# Patient Record
Sex: Male | Born: 1970 | Race: White | Hispanic: No | Marital: Married | Smoking: Never smoker
Health system: Southern US, Community
[De-identification: ages and names within clinical notes are randomized; demographics above are authoritative.]

---

## 2004-05-15 ENCOUNTER — Encounter: Admission: RE | Admit: 2004-05-15 | Discharge: 2004-05-15 | Payer: Self-pay | Admitting: *Deleted

## 2006-12-13 HISTORY — PX: OTHER SURGICAL HISTORY: SHX169

## 2008-03-05 ENCOUNTER — Ambulatory Visit (HOSPITAL_BASED_OUTPATIENT_CLINIC_OR_DEPARTMENT_OTHER): Admission: RE | Admit: 2008-03-05 | Discharge: 2008-03-05 | Payer: Self-pay | Admitting: Orthopedic Surgery

## 2009-02-15 ENCOUNTER — Emergency Department (HOSPITAL_BASED_OUTPATIENT_CLINIC_OR_DEPARTMENT_OTHER): Admission: EM | Admit: 2009-02-15 | Discharge: 2009-02-15 | Payer: Self-pay | Admitting: Emergency Medicine

## 2009-02-15 ENCOUNTER — Ambulatory Visit: Payer: Self-pay | Admitting: Interventional Radiology

## 2009-12-13 HISTORY — PX: RESECTION DISTAL CLAVICAL: SHX5053

## 2010-04-18 ENCOUNTER — Emergency Department (HOSPITAL_COMMUNITY): Admission: EM | Admit: 2010-04-18 | Discharge: 2010-04-18 | Payer: Self-pay | Admitting: Emergency Medicine

## 2010-04-18 ENCOUNTER — Emergency Department (HOSPITAL_COMMUNITY): Admission: EM | Admit: 2010-04-18 | Discharge: 2010-04-18 | Payer: Self-pay | Admitting: Family Medicine
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
## 2011-12-14 HISTORY — PX: ORIF TIBIA & FIBULA FRACTURES: SHX2131

## 2013-07-13 ENCOUNTER — Encounter: Payer: Self-pay | Admitting: Sports Medicine

## 2013-07-13 ENCOUNTER — Ambulatory Visit (INDEPENDENT_AMBULATORY_CARE_PROVIDER_SITE_OTHER): Payer: Self-pay | Admitting: Sports Medicine

## 2013-07-13 VITALS — BP 116/75 | HR 55 | Wt 174.0 lb

## 2013-07-13 DIAGNOSIS — Z23 Encounter for immunization: Secondary | ICD-10-CM

## 2013-07-13 DIAGNOSIS — Z7184 Encounter for health counseling related to travel: Secondary | ICD-10-CM | POA: Insufficient documentation

## 2013-07-13 DIAGNOSIS — Z299 Encounter for prophylactic measures, unspecified: Secondary | ICD-10-CM | POA: Insufficient documentation

## 2013-07-13 DIAGNOSIS — Z7189 Other specified counseling: Secondary | ICD-10-CM

## 2013-07-13 LAB — COMPREHENSIVE METABOLIC PANEL WITH GFR
ALT: 27 U/L (ref 0–53)
AST: 43 U/L — ABNORMAL HIGH (ref 0–37)
Albumin: 4.5 g/dL (ref 3.5–5.2)

## 2013-07-13 LAB — CBC
HCT: 44.3 % (ref 39.0–52.0)
Hemoglobin: 15.7 g/dL (ref 13.0–17.0)
MCH: 30.4 pg (ref 26.0–34.0)
MCHC: 35.4 g/dL (ref 30.0–36.0)
MCV: 85.7 fL (ref 78.0–100.0)
Platelets: 325 10*3/uL (ref 150–400)
RBC: 5.17 MIL/uL (ref 4.22–5.81)
RDW: 13.4 % (ref 11.5–15.5)
WBC: 4.9 K/uL (ref 4.0–10.5)

## 2013-07-13 LAB — LIPID PANEL
Cholesterol: 138 mg/dL (ref 0–200)
HDL: 41 mg/dL (ref >39)
LDL Cholesterol: 83 mg/dL (ref 0–99)
Total CHOL/HDL Ratio: 3.4 Ratio
Triglycerides: 69 mg/dL (ref <150)
VLDL: 14 mg/dL (ref 0–40)

## 2013-07-13 LAB — COMPREHENSIVE METABOLIC PANEL
Alkaline Phosphatase: 69 U/L (ref 39–117)
BUN: 15 mg/dL (ref 6–23)
CO2: 31 mEq/L (ref 19–32)
Calcium: 9.4 mg/dL (ref 8.4–10.5)
Chloride: 102 mEq/L (ref 96–112)
Creat: 0.93 mg/dL (ref 0.50–1.35)
Glucose, Bld: 93 mg/dL (ref 70–99)
Potassium: 4.6 mEq/L (ref 3.5–5.3)
Sodium: 137 mEq/L (ref 135–145)
Total Bilirubin: 0.9 mg/dL (ref 0.3–1.2)
Total Protein: 7.1 g/dL (ref 6.0–8.3)

## 2013-07-13 LAB — HEPATITIS B SURFACE ANTIBODY,QUALITATIVE: Hep B S Ab: POSITIVE — AB

## 2013-07-13 LAB — TSH: TSH: 1.303 u[IU]/mL (ref 0.350–4.500)

## 2013-07-13 MED ORDER — CIPROFLOXACIN HCL 500 MG PO TABS: 500.0000 mg | ORAL_TABLET | Freq: Two times a day (BID) | ORAL | Status: DC

## 2013-07-13 MED ORDER — MEFLOQUINE HCL 250 MG PO TABS: 250.0000 mg | ORAL_TABLET | ORAL | Status: DC

## 2013-07-13 NOTE — Assessment & Plan Note (Signed)
Tdap given. Mefloquine for malaria prophylaxis. Cipro for traveler's diarrhea.

## 2013-07-13 NOTE — Addendum Note (Signed)
Addended by: Pixie Casino on: 07/13/2013 09:26 AM   Modules accepted: Orders
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
## 2013-07-13 NOTE — Assessment & Plan Note (Signed)
Healthy male. Physical exam performed. Checking routine blood work including hepatitis titers.

## 2013-07-14 LAB — VITAMIN D 25 HYDROXY (VIT D DEFICIENCY, FRACTURES): Vit D, 25-Hydroxy: 39 ng/mL (ref 30–89)

## 2013-07-14 LAB — HEPATITIS A ANTIBODY, TOTAL: Hep A Total Ab: POSITIVE — AB

## 2013-07-19 ENCOUNTER — Encounter: Payer: Self-pay | Admitting: Sports Medicine

## 2013-12-14 ENCOUNTER — Other Ambulatory Visit: Payer: Self-pay | Admitting: Sports Medicine

## 2013-12-14 ENCOUNTER — Ambulatory Visit
Admission: RE | Admit: 2013-12-14 | Discharge: 2013-12-14 | Disposition: A | Discharge status: Home / Self Care | Payer: 59 | Source: Ambulatory Visit | Attending: Sports Medicine | Admitting: Sports Medicine

## 2013-12-14 DIAGNOSIS — S92109P Unspecified fracture of unspecified talus, subsequent encounter for fracture with malunion: Secondary | ICD-10-CM

## 2014-08-08 ENCOUNTER — Emergency Department (HOSPITAL_BASED_OUTPATIENT_CLINIC_OR_DEPARTMENT_OTHER)
Admission: EM | Admit: 2014-08-08 | Discharge: 2014-08-09 | Disposition: A | Discharge status: Home / Self Care | Payer: 59 | Attending: Emergency Medicine | Admitting: Emergency Medicine

## 2014-08-08 ENCOUNTER — Encounter (HOSPITAL_BASED_OUTPATIENT_CLINIC_OR_DEPARTMENT_OTHER): Payer: Self-pay | Admitting: Emergency Medicine

## 2014-08-08 DIAGNOSIS — S52611A Displaced fracture of right ulna styloid process, initial encounter for closed fracture: Secondary | ICD-10-CM

## 2014-08-08 DIAGNOSIS — S59909A Unspecified injury of unspecified elbow, initial encounter: Secondary | ICD-10-CM | POA: Insufficient documentation

## 2014-08-08 DIAGNOSIS — Z8781 Personal history of (healed) traumatic fracture: Secondary | ICD-10-CM | POA: Diagnosis not present

## 2014-08-08 DIAGNOSIS — Y92410 Unspecified street and highway as the place of occurrence of the external cause: Secondary | ICD-10-CM | POA: Diagnosis not present

## 2014-08-08 DIAGNOSIS — Z792 Long term (current) use of antibiotics: Secondary | ICD-10-CM | POA: Diagnosis not present

## 2014-08-08 DIAGNOSIS — S60229A Contusion of unspecified hand, initial encounter: Secondary | ICD-10-CM | POA: Insufficient documentation

## 2014-08-08 DIAGNOSIS — Y9389 Activity, other specified: Secondary | ICD-10-CM | POA: Diagnosis not present

## 2014-08-08 DIAGNOSIS — S52509A Unspecified fracture of the lower end of unspecified radius, initial encounter for closed fracture: Secondary | ICD-10-CM | POA: Insufficient documentation

## 2014-08-08 DIAGNOSIS — S40211A Abrasion of right shoulder, initial encounter: Secondary | ICD-10-CM

## 2014-08-08 DIAGNOSIS — S6990XA Unspecified injury of unspecified wrist, hand and finger(s), initial encounter: Secondary | ICD-10-CM

## 2014-08-08 DIAGNOSIS — IMO0002 Reserved for concepts with insufficient information to code with codable children: Secondary | ICD-10-CM | POA: Insufficient documentation

## 2014-08-08 DIAGNOSIS — S52609A Unspecified fracture of lower end of unspecified ulna, initial encounter for closed fracture: Secondary | ICD-10-CM | POA: Diagnosis not present

## 2014-08-08 DIAGNOSIS — S59919A Unspecified injury of unspecified forearm, initial encounter: Secondary | ICD-10-CM

## 2014-08-08 DIAGNOSIS — S60222A Contusion of left hand, initial encounter: Secondary | ICD-10-CM

## 2014-08-08 DIAGNOSIS — S52501A Unspecified fracture of the lower end of right radius, initial encounter for closed fracture: Secondary | ICD-10-CM

## 2014-08-08 NOTE — ED Notes (Signed)
Right wrist, right scapula injury. States he fell. Ice pack on arrival.

## 2014-08-08 NOTE — ED Notes (Signed)
Pt has  Abrasion to R medial thoracic back, pt sts it "hit the ground". Abrasion to Left foream10cmx 4xcm, edema to left hand area, edma with pain and decreased ROM to R wrist. Pt sts pain 8/10.

## 2014-08-09 ENCOUNTER — Emergency Department (HOSPITAL_BASED_OUTPATIENT_CLINIC_OR_DEPARTMENT_OTHER): Payer: 59

## 2014-08-09 MED ORDER — OXYCODONE-ACETAMINOPHEN 5-325 MG PO TABS: 2.0000 | ORAL_TABLET | ORAL | Status: DC | PRN

## 2014-08-09 MED ORDER — OXYCODONE-ACETAMINOPHEN 5-325 MG PO TABS
2.0000 | ORAL_TABLET | Freq: Once | ORAL | Status: AC
  Administered 2014-08-09: 2 via ORAL
  Filled 2014-08-09: qty 2
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
## 2014-08-09 NOTE — Discharge Instructions (Signed)
Keep splint in place until seen by your orthopedist.  Keep elevated when possible to help with swelling.  Take pain medication as prescribed.   Abrasion An abrasion is a cut or scrape of the skin. Abrasions do not extend through all layers of the skin and most heal within 10 days. It is important to care for your abrasion properly to prevent infection. CAUSES  Most abrasions are caused by falling on, or gliding across, the ground or other surface. When your skin rubs on something, the outer and inner layer of skin rubs off, causing an abrasion. DIAGNOSIS  Your caregiver will be able to diagnose an abrasion during a physical exam.  TREATMENT  Your treatment depends on how large and deep the abrasion is. Generally, your abrasion will be cleaned with water and a mild soap to remove any dirt or debris. An antibiotic ointment may be put over the abrasion to prevent an infection. A bandage (dressing) may be wrapped around the abrasion to keep it from getting dirty.  You may need a tetanus shot if:  You cannot remember when you had your last tetanus shot.  You have never had a tetanus shot.  The injury broke your skin. If you get a tetanus shot, your arm may swell, get red, and feel warm to the touch. This is common and not a problem. If you need a tetanus shot and you choose not to have one, there is a rare chance of getting tetanus. Sickness from tetanus can be serious.  HOME CARE INSTRUCTIONS   If a dressing was applied, change it at least once a day or as directed by your caregiver. If the bandage sticks, soak it off with warm water.   Wash the area with water and a mild soap to remove all the ointment 2 times a day. Rinse off the soap and pat the area dry with a clean towel.   Reapply any ointment as directed by your caregiver. This will help prevent infection and keep the bandage from sticking. Use gauze over the wound and under the dressing to help keep the bandage from sticking.    Change your dressing right away if it becomes wet or dirty.   Only take over-the-counter or prescription medicines for pain, discomfort, or fever as directed by your caregiver.   Follow up with your caregiver within 24-48 hours for a wound check, or as directed. If you were not given a wound-check appointment, look closely at your abrasion for redness, swelling, or pus. These are signs of infection. SEEK IMMEDIATE MEDICAL CARE IF:   You have increasing pain in the wound.   You have redness, swelling, or tenderness around the wound.   You have pus coming from the wound.   You have a fever or persistent symptoms for more than 2-3 days.  You have a fever and your symptoms suddenly get worse.  You have a bad smell coming from the wound or dressing.  MAKE SURE YOU:   Understand these instructions.  Will watch your condition.  Will get help right away if you are not doing well or get worse. Document Released: 09/08/2005 Document Revised: 11/15/2012 Document Reviewed: 11/02/2011 [facility] Patient Information 2015 [place], [place]. This information is not intended to replace advice given to you by your health care provider. Make sure you discuss any questions you have with your health care provider.  Cast or Splint Care Casts and splints support injured limbs and keep bones from moving while they heal.  HOME CARE  Keep the cast or splint uncovered during the drying period.  A plaster cast can take 24 to 48 hours to dry.  A fiberglass cast will dry in less than 1 hour.  Do not rest the cast on anything harder than a pillow for 24 hours.  Do not put weight on your injured limb. Do not put pressure on the cast. Wait for your doctor's approval.  Keep the cast or splint dry.  Cover the cast or splint with a plastic bag during baths or wet weather.  If you have a cast over your chest and belly (trunk), take sponge baths until the cast is taken off.  If your cast gets wet,  dry it with a towel or blow dryer. Use the cool setting on the blow dryer.  Keep your cast or splint clean. Wash a dirty cast with a damp cloth.  Do not put any objects under your cast or splint.  Do not scratch the skin under the cast with an object. If itching is a problem, use a blow dryer on a cool setting over the itchy area.  Do not trim or cut your cast.  Do not take out the padding from inside your cast.  Exercise your joints near the cast as told by your doctor.  Raise (elevate) your injured limb on 1 or 2 pillows for the first 1 to 3 days. GET HELP IF:  Your cast or splint cracks.  Your cast or splint is too tight or too loose.  You itch badly under the cast.  Your cast gets wet or has a soft spot.  You have a bad smell coming from the cast.  You get an object stuck under the cast.  Your skin around the cast becomes red or sore.  You have new or more pain after the cast is put on. GET HELP RIGHT AWAY IF:  You have fluid leaking through the cast.  You cannot move your fingers or toes.  Your fingers or toes turn blue or white or are cool, painful, or puffy (swollen).  You have tingling or lose feeling (numbness) around the injured area.  You have bad pain or pressure under the cast.  You have trouble breathing or have shortness of breath.  You have chest pain. Document Released: 03/31/2011 Document Revised: 08/01/2013 Document Reviewed: 06/07/2013 [facility] Patient Information 2015 [place], [place]. This information is not intended to replace advice given to you by your health care provider. Make sure you discuss any questions you have with your health care provider.  Contusion A contusion is a deep bruise. Contusions are the result of an injury that caused bleeding under the skin. The contusion may turn blue, purple, or yellow. Minor injuries will give you a painless contusion, but more severe contusions may stay painful and swollen for a few weeks.  CAUSES   A contusion is usually caused by a blow, trauma, or direct force to an area of the body. SYMPTOMS   Swelling and redness of the injured area.  Bruising of the injured area.  Tenderness and soreness of the injured area.  Pain. DIAGNOSIS  The diagnosis can be made by taking a history and physical exam. An X-ray, CT scan, or MRI may be needed to determine if there were any associated injuries, such as fractures. TREATMENT  Specific treatment will depend on what area of the body was injured. In general, the best treatment for a contusion is resting, icing, elevating, and applying cold  compresses to the injured area. Over-the-counter medicines may also be recommended for pain control. Ask your caregiver what the best treatment is for your contusion. HOME CARE INSTRUCTIONS   Put ice on the injured area.  Put ice in a plastic bag.  Place a towel between your skin and the bag.  Leave the ice on for 15-20 minutes, 3-4 times a day, or as directed by your health care provider.  Only take over-the-counter or prescription medicines for pain, discomfort, or fever as directed by your caregiver. Your caregiver may recommend avoiding anti-inflammatory medicines (aspirin, ibuprofen, and naproxen) for 48 hours because these medicines may increase bruising.  Rest the injured area.  If possible, elevate the injured area to reduce swelling. SEEK IMMEDIATE MEDICAL CARE IF:   You have increased bruising or swelling.  You have pain that is getting worse.  Your swelling or pain is not relieved with medicines. MAKE SURE YOU:   Understand these instructions.  Will watch your condition.  Will get help right away if you are not doing well or get worse. Document Released: 09/08/2005 Document Revised: 12/04/2013 Document Reviewed: 10/04/2011 [facility] Patient Information 2015 [place], [place]. This information is not intended to replace advice given to you by your health care provider. Make sure you  discuss any questions you have with your health care provider.  Forearm Fracture Your caregiver has diagnosed you as having a broken bone (fracture) of the forearm. This is the part of your arm between the elbow and your wrist. Your forearm is made up of two bones. These are the radius and ulna. A fracture is a break in one or both bones. A cast or splint is used to protect and keep your injured bone from moving. The cast or splint will be on generally for about 5 to 6 weeks, with individual variations. HOME CARE INSTRUCTIONS   Keep the injured part elevated while sitting or lying down. Keeping the injury above the level of your heart (the center of the chest). This will decrease swelling and pain.  Apply ice to the injury for 15-20 minutes, 03-04 times per day while awake, for 2 days. Put the ice in a plastic bag and place a thin towel between the bag of ice and your cast or splint.  If you have a plaster or fiberglass cast:  Do not try to scratch the skin under the cast using sharp or pointed objects.  Check the skin around the cast every day. You may put lotion on any red or sore areas.  Keep your cast dry and clean.  If you have a plaster splint:  Wear the splint as directed.  You may loosen the elastic around the splint if your fingers become numb, tingle, or turn cold or blue.  Do not put pressure on any part of your cast or splint. It may break. Rest your cast only on a pillow the first 24 hours until it is fully hardened.  Your cast or splint can be protected during bathing with a plastic bag. Do not lower the cast or splint into water.  Only take over-the-counter or prescription medicines for pain, discomfort, or fever as directed by your caregiver. SEEK IMMEDIATE MEDICAL CARE IF:   Your cast gets damaged or breaks.  You have more severe pain or swelling than you did before the cast.  Your skin or nails below the injury turn blue or gray, or feel cold or numb.  There is  a bad smell or new stains  and/or pus like (purulent) drainage coming from under the cast. MAKE SURE YOU:   Understand these instructions.  Will watch your condition. Radial Fracture You have a broken bone (fracture) of the forearm. This is the part of your arm between the elbow and your wrist. Your forearm is made up of two bones. These are the radius and ulna. Your fracture is in the radial shaft. This is the bone in your forearm located on the thumb side. A cast or splint is used to protect and keep your injured bone from moving. The cast or splint will be on generally for about 5 to 6 weeks, with individual variations. HOME CARE INSTRUCTIONS  Keep the injured part elevated while sitting or lying down. Keep the injury above the level of your heart (the center of the chest). This will decrease swelling and pain. Apply ice to the injury for 15-20 minutes, 03-04 times per day while awake, for 2 days. Put the ice in a plastic bag and place a towel between the bag of ice and your cast or splint. Move your fingers to avoid stiffness and minimize swelling. If you have a plaster or fiberglass cast: Do not try to scratch the skin under the cast using sharp or pointed objects. Check the skin around the cast every day. You may put lotion on any red or sore areas. Keep your cast dry and clean. If you have a plaster splint: Wear the splint as directed. You may loosen the elastic around the splint if your fingers become numb, tingle, or turn cold or blue. Do not put pressure on any part of your cast or splint. It may break. Rest your cast only on a pillow for the first 24 hours until it is fully hardened. Your cast or splint can be protected during bathing with a plastic bag. Do not lower the cast or splint into water. Only take over-the-counter or prescription medicines for pain, discomfort, or fever as directed by your caregiver. SEEK IMMEDIATE MEDICAL CARE IF:  Your cast gets damaged or breaks. You  have more severe pain or swelling than you did before getting the cast. You have severe pain when stretching your fingers. There is a bad smell, new stains and/or pus-like (purulent) drainage coming from under the cast. Your fingers or hand turn pale or blue and become cold or your loose feeling. Document Released: 05/12/2006 Document Revised: 02/21/2012 Document Reviewed: 08/08/2006 [facility] Patient Information 2015 [place], [place]. This information is not intended to replace advice given to you by your health care provider. Make sure you discuss any questions you have with your health care provider.   Will get help right away if you are not doing well or get worse. Document Released: 11/26/2000 Document Revised: 02/21/2012 Document Reviewed: 07/18/2008 [facility] Patient Information 2015 [place], [place]. This information is not intended to replace advice given to you by your health care provider. Make sure you discuss any questions you have with your health care provider.

## 2014-08-20 ENCOUNTER — Other Ambulatory Visit: Payer: Self-pay | Admitting: Orthopedic Surgery

## 2014-08-20 DIAGNOSIS — T148XXA Other injury of unspecified body region, initial encounter: Secondary | ICD-10-CM

## 2014-08-22 ENCOUNTER — Ambulatory Visit
Admission: RE | Admit: 2014-08-22 | Discharge: 2014-08-22 | Disposition: A | Discharge status: Home / Self Care | Payer: 59 | Source: Ambulatory Visit | Attending: Orthopedic Surgery | Admitting: Orthopedic Surgery

## 2014-08-22 ENCOUNTER — Inpatient Hospital Stay: Admission: RE | Admit: 2014-08-22 | Payer: 59 | Source: Ambulatory Visit

## 2014-08-22 DIAGNOSIS — T148XXA Other injury of unspecified body region, initial encounter: Secondary | ICD-10-CM

## 2015-12-30 IMAGING — CR DG SCAPULA*R*
1 series · 1 of 1 positions shown · non-contrast
Comparison: 04/18/2010

CLINICAL DATA: Right scapular pain with limited range of motion
after a fall.

EXAM:
RIGHT SCAPULA - 2+ VIEWS

[w scapula ap/pa right *]
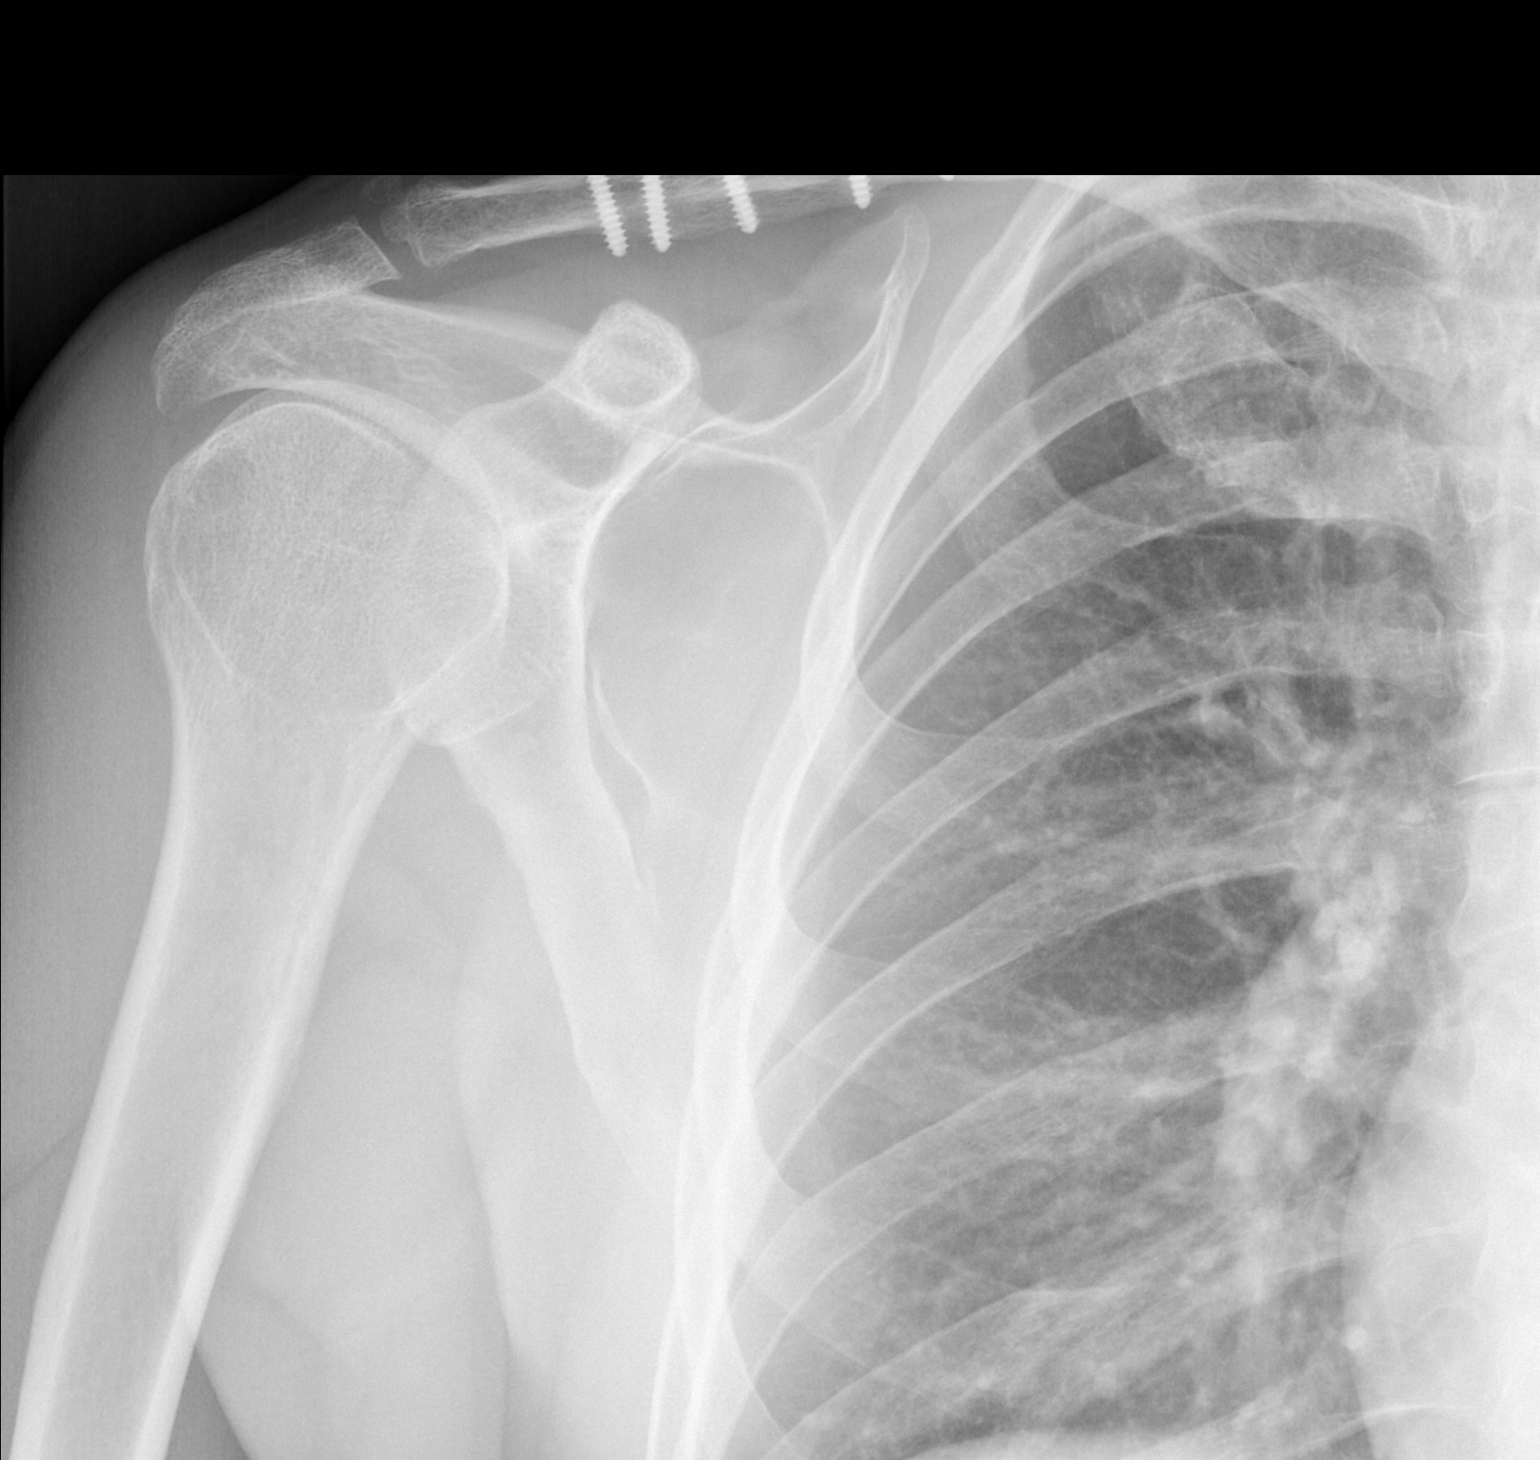

[1 of 1 positions shown; findings below may reference images not displayed]

FINDINGS: Plate and screw fixation of the clavicle. Old ununited ossicle
superior to the acromioclavicular joint. Right scapula appears
intact. No displaced acute fractures identified.
IMPRESSION: No acute bony abnormalities.

## 2021-09-25 ENCOUNTER — Ambulatory Visit (HOSPITAL_COMMUNITY)
Admission: EM | Admit: 2021-09-25 | Discharge: 2021-09-25 | Disposition: A | Discharge status: Home / Self Care | Payer: 59 | Attending: Urgent Care | Admitting: Urgent Care

## 2021-09-25 ENCOUNTER — Other Ambulatory Visit: Payer: Self-pay

## 2021-09-25 ENCOUNTER — Ambulatory Visit (INDEPENDENT_AMBULATORY_CARE_PROVIDER_SITE_OTHER): Payer: 59

## 2021-09-25 ENCOUNTER — Encounter (HOSPITAL_COMMUNITY): Payer: Self-pay

## 2021-09-25 DIAGNOSIS — M25532 Pain in left wrist: Secondary | ICD-10-CM | POA: Diagnosis not present

## 2021-09-25 NOTE — Discharge Instructions (Signed)
Wear the wrist splint for comfort.   You can take Tylenol and/or Ibuprofen as needed for pain relief and fever reduction.   Rest as much as possible Ice for 10-15 minutes every 4-6 hours as needed for pain and swelling Compression- use an ace bandage or splint for comfort Elevate above your hip/heart when sitting and laying down  Follow up with orthopedics as soon as possible for re-evaluation.
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
## 2021-09-25 NOTE — ED Notes (Signed)
Pt refused splint
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
